# Patient Record
Sex: Female | Born: 1965 | Race: White | Hispanic: No | Marital: Single | Smoking: Never smoker
Health system: Southern US, Community
[De-identification: ages and names within clinical notes are randomized; demographics above are authoritative.]

## PROBLEM LIST (undated history)

## (undated) HISTORY — PX: PARTIAL HYSTERECTOMY: SHX80

## (undated) HISTORY — PX: BREAST ENHANCEMENT SURGERY: SHX7

---

## 2020-04-04 ENCOUNTER — Ambulatory Visit (INDEPENDENT_AMBULATORY_CARE_PROVIDER_SITE_OTHER): Payer: Commercial Managed Care - PPO

## 2020-04-04 ENCOUNTER — Encounter: Payer: Self-pay | Admitting: Podiatry

## 2020-04-04 ENCOUNTER — Ambulatory Visit (INDEPENDENT_AMBULATORY_CARE_PROVIDER_SITE_OTHER): Payer: Commercial Managed Care - PPO | Admitting: Podiatry

## 2020-04-04 ENCOUNTER — Other Ambulatory Visit: Payer: Self-pay

## 2020-04-04 DIAGNOSIS — Q66229 Congenital metatarsus adductus, unspecified foot: Secondary | ICD-10-CM | POA: Diagnosis not present

## 2020-04-04 DIAGNOSIS — M778 Other enthesopathies, not elsewhere classified: Secondary | ICD-10-CM

## 2020-04-04 DIAGNOSIS — M79672 Pain in left foot: Secondary | ICD-10-CM

## 2020-04-04 DIAGNOSIS — M21619 Bunion of unspecified foot: Secondary | ICD-10-CM | POA: Diagnosis not present

## 2020-04-04 DIAGNOSIS — M79671 Pain in right foot: Secondary | ICD-10-CM

## 2020-04-04 MED ORDER — TRIAMCINOLONE ACETONIDE 10 MG/ML IJ SUSP
10.0000 mg | Freq: Once | INTRAMUSCULAR | Status: AC
  Administered 2020-04-04: 10 mg

## 2020-04-04 NOTE — Progress Notes (Signed)
Subjective:   Patient ID: Breanna Herrera, female   DOB: 55 y.o.   MRN: [id number]   HPI Patient presents that she has had a severe bunion deformity right for a while and recently it is becoming more painful for her and more difficult to wear shoe gear with comfortably.  Also points to the top of the foot and states that it is become inflamed and become more aggravating over the last couple years and at times it seems more swollen than other times.  Patient does not smoke likes to be active   Review of Systems  All other systems reviewed and are negative.       Objective:  Physical Exam Vitals and nursing note reviewed.  Constitutional:      Appearance: She is well-developed and well-nourished.  Cardiovascular:     Pulses: Intact distal pulses.  Pulmonary:     Effort: Pulmonary effort is normal.  Musculoskeletal:        General: Normal range of motion.  Skin:    General: Skin is warm.  Neurological:     Mental Status: She is alert.     Neurovascular status intact muscle strength found to be adequate range of motion within normal limits.  Patient has severe bunion deformity right over left with redness around the first metatarsal head deviation of the hallux against the second toe with keratotic tissue around the medial side of the hallux and metatarsal.  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  I do think it will require a Lapidus type fusion for the right first metatarsal and I did discuss possible work on the midfoot with spur removal or other procedures with possible MRI if it does not  respond to conservative care with today sterile prep done and injected the tendon complex 3 mg Dexasone Kenalog. Revaluate one month  xrays indicate severe elevation im angle right over left with met adductus foot structure and spur dorsal right midfoot

## 2020-05-03 ENCOUNTER — Encounter: Payer: Self-pay | Admitting: Podiatry

## 2020-05-03 ENCOUNTER — Ambulatory Visit (INDEPENDENT_AMBULATORY_CARE_PROVIDER_SITE_OTHER): Payer: Commercial Managed Care - PPO | Admitting: Podiatry

## 2020-05-03 ENCOUNTER — Other Ambulatory Visit: Payer: Self-pay

## 2020-05-03 DIAGNOSIS — M778 Other enthesopathies, not elsewhere classified: Secondary | ICD-10-CM | POA: Diagnosis not present

## 2020-05-03 DIAGNOSIS — Q66229 Congenital metatarsus adductus, unspecified foot: Secondary | ICD-10-CM | POA: Diagnosis not present

## 2020-05-03 DIAGNOSIS — M21619 Bunion of unspecified foot: Secondary | ICD-10-CM | POA: Diagnosis not present

## 2020-05-06 NOTE — Progress Notes (Signed)
Subjective:   Patient ID: Breanna Herrera, female   DOB: 55 y.o.   MRN: [id number]   HPI Patient states that the injection we gave at last visit did not help her midfoot and it is actually becoming more painful and swollen and of course she has the severe bunion deformity   ROS      Objective:  Physical Exam  Neurovascular status intact unchanged with patient found to have significant midfoot arthritis with inflammation fluid secondary to severe structural adductus deformity and structural bunion deformity     Assessment:  Chronic midfoot arthritis difficult to identify which joints are involved along with inflammatory changes and structural bunion     Plan:  H&P reviewed condition and reviewed that most likely she is getting the midfoot work done along with the bunion correction. I am sending her for an MRI to better understand the pathology of her midfoot and she will be seen back when we get results and we will start surgical planning for this patient with plans to be able to do this at the beginning of June. Spent a great deal of time going over with her the complexity of her case and we need results before we could make accurate decisions

## 2020-05-29 ENCOUNTER — Ambulatory Visit
Admission: RE | Admit: 2020-05-29 | Discharge: 2020-05-29 | Disposition: A | Discharge status: Home / Self Care | Payer: Commercial Managed Care - PPO | Source: Ambulatory Visit | Attending: Podiatry | Admitting: Podiatry

## 2020-05-29 ENCOUNTER — Other Ambulatory Visit: Payer: Self-pay

## 2020-05-29 DIAGNOSIS — Q66229 Congenital metatarsus adductus, unspecified foot: Secondary | ICD-10-CM

## 2020-06-01 NOTE — Progress Notes (Signed)
mri

## 2020-06-04 ENCOUNTER — Other Ambulatory Visit: Payer: Self-pay | Admitting: *Deleted

## 2020-06-04 DIAGNOSIS — Z1231 Encounter for screening mammogram for malignant neoplasm of breast: Secondary | ICD-10-CM

## 2020-06-13 ENCOUNTER — Encounter: Payer: Self-pay | Admitting: Gastroenterology

## 2020-06-27 ENCOUNTER — Other Ambulatory Visit: Payer: Self-pay

## 2020-06-27 ENCOUNTER — Ambulatory Visit (AMBULATORY_SURGERY_CENTER): Payer: Self-pay | Admitting: *Deleted

## 2020-06-27 VITALS — Ht 65.0 in | Wt 186.0 lb

## 2020-06-27 DIAGNOSIS — Z1211 Encounter for screening for malignant neoplasm of colon: Secondary | ICD-10-CM

## 2020-06-27 MED ORDER — NA SULFATE-K SULFATE-MG SULF 17.5-3.13-1.6 GM/177ML PO SOLN: ORAL | 0 refills | Status: DC

## 2020-06-27 NOTE — Progress Notes (Signed)
Patient is here in-person for PV. Patient denies any allergies to eggs or soy. Patient denies any problems with anesthesia/sedation. Patient denies any oxygen use at home. Patient denies taking any diet/weight loss medications or blood thinners. Patient is not being treated for MRSA or C-diff. Patient is aware of our care-partner policy and [id number] safety protocol. EMMI education assigned to the patient for the procedure, sent to [facility].   Patient is fully COVID-19 vaccinated, per patient.  Patient will continue to take Miralax daily.

## 2020-07-07 DIAGNOSIS — L03116 Cellulitis of left lower limb: Secondary | ICD-10-CM | POA: Diagnosis not present

## 2020-07-10 ENCOUNTER — Encounter: Payer: Self-pay | Admitting: Gastroenterology

## 2020-07-11 ENCOUNTER — Ambulatory Visit (AMBULATORY_SURGERY_CENTER): Payer: Commercial Managed Care - PPO | Admitting: Gastroenterology

## 2020-07-11 ENCOUNTER — Encounter: Payer: Self-pay | Admitting: Gastroenterology

## 2020-07-11 ENCOUNTER — Other Ambulatory Visit: Payer: Self-pay

## 2020-07-11 VITALS — BP 112/68 | HR 66 | Temp 97.3°F | Resp 15 | Ht 65.0 in | Wt 186.0 lb

## 2020-07-11 DIAGNOSIS — D128 Benign neoplasm of rectum: Secondary | ICD-10-CM

## 2020-07-11 DIAGNOSIS — K621 Rectal polyp: Secondary | ICD-10-CM

## 2020-07-11 DIAGNOSIS — Z1211 Encounter for screening for malignant neoplasm of colon: Secondary | ICD-10-CM

## 2020-07-11 DIAGNOSIS — D120 Benign neoplasm of cecum: Secondary | ICD-10-CM

## 2020-07-11 MED ORDER — SODIUM CHLORIDE 0.9 % IV SOLN: 500.0000 mL | Freq: Once | INTRAVENOUS | Status: DC

## 2020-07-11 NOTE — Op Note (Signed)
[facility] Patient Name: Breanna Herrera Procedure Date: 07/11/2020 9:06 AM MRN: [id number] Endoscopist: Remo Lipps P. Havery Moros , MD Age: 55 Referring MD:  Date of Birth: 06-15-1965 Gender: Female Account #: [id number] Procedure:                Colonoscopy Indications:              Screening for colorectal malignant neoplasm, This                            is the patient's first colonoscopy Medicines:                Monitored Anesthesia Care Procedure:                Pre-Anesthesia Assessment:                           - Prior to the procedure, a History and Physical                            was performed, and patient medications and                            allergies were reviewed. The patient's tolerance of                            previous anesthesia was also reviewed. The risks                            and benefits of the procedure and the sedation                            options and risks were discussed with the patient.                            All questions were answered, and informed consent                            was obtained. Prior Anticoagulants: The patient has                            taken no previous anticoagulant or antiplatelet                            agents. ASA Grade Assessment: II - A patient with                            mild systemic disease. After reviewing the risks                            and benefits, the patient was deemed in                            satisfactory condition to undergo the procedure.  After obtaining informed consent, the colonoscope                            was passed under direct vision. Throughout the                            procedure, the patient's blood pressure, pulse, and                            oxygen saturations were monitored continuously. The                            Olympus PCF-H190DL ([id number]) Colonoscope was                            introduced through the  anus and advanced to the the                            cecum, identified by appendiceal orifice and                            ileocecal valve. The colonoscopy was performed                            without difficulty. The patient tolerated the                            procedure well. The quality of the bowel                            preparation was good. The ileocecal valve,                            appendiceal orifice, and rectum were photographed. Scope In: 9:17:15 AM Scope Out: 9:35:55 AM Scope Withdrawal Time: 0 hours 16 minutes 15 seconds  Total Procedure Duration: 0 hours 18 minutes 40 seconds  Findings:                 The perianal and digital rectal examinations were                            normal.                           A diminutive polyp was found in the cecum. The                            polyp was sessile. The polyp was removed with a                            cold snare. Resection and retrieval were complete.                           A 3 mm polyp was found in the rectum. The polyp was  sessile. The polyp was removed with a cold snare.                            Resection and retrieval were complete.                           Internal hemorrhoids were found during                            retroflexion. The hemorrhoids were small.                           The exam was otherwise without abnormality. Complications:            No immediate complications. Estimated blood loss:                            Minimal. Estimated Blood Loss:     Estimated blood loss was minimal. Impression:               - One diminutive polyp in the cecum, removed with a                            cold snare. Resected and retrieved.                           - One 3 mm polyp in the rectum, removed with a cold                            snare. Resected and retrieved.                           - Internal hemorrhoids.                           - The  examination was otherwise normal. Recommendation:           - Patient has a contact number available for                            emergencies. The signs and symptoms of potential                            delayed complications were discussed with the                            patient. Return to normal activities tomorrow.                            Written discharge instructions were provided to the                            patient.                           - Resume previous diet.                           -  Continue present medications.                           - Await pathology results. Viviann Spare P. Akera Snowberger, MD 07/11/2020 9:38:59 AM This report has been signed electronically.

## 2020-07-11 NOTE — Progress Notes (Signed)
To pacu, VSS. Report to [email or web address].tb °

## 2020-07-11 NOTE — Patient Instructions (Signed)
YOU HAD AN ENDOSCOPIC PROCEDURE TODAY AT THE [facility]:   Refer to the procedure report that was given to you for any specific questions about what was found during the examination.  If the procedure report does not answer your questions, please call your gastroenterologist to clarify.  If you requested that your care partner not be given the details of your procedure findings, then the procedure report has been included in a sealed envelope for you to review at your convenience later.  YOU SHOULD EXPECT: Some feelings of bloating in the abdomen. Passage of more gas than usual.  Walking can help get rid of the air that was put into your GI tract during the procedure and reduce the bloating. If you had a lower endoscopy (such as a colonoscopy or flexible sigmoidoscopy) you may notice spotting of blood in your stool or on the toilet paper. If you underwent a bowel prep for your procedure, you may not have a normal bowel movement for a few days.  Please Note:  You might notice some irritation and congestion in your nose or some drainage.  This is from the oxygen used during your procedure.  There is no need for concern and it should clear up in a day or so.  SYMPTOMS TO REPORT IMMEDIATELY:   Following lower endoscopy (colonoscopy or flexible sigmoidoscopy):  Excessive amounts of blood in the stool  Significant tenderness or worsening of abdominal pains  Swelling of the abdomen that is new, acute  Fever of 100F or higher   Following upper endoscopy (EGD)  Vomiting of blood or coffee ground material  New chest pain or pain under the shoulder blades  Painful or persistently difficult swallowing  New shortness of breath  Fever of 100F or higher  Black, tarry-looking stools  For urgent or emergent issues, a gastroenterologist can be reached at any hour by calling [phone]. Do not use MyChart messaging for urgent concerns.    DIET:  We do recommend a small meal at first, but  then you may proceed to your regular diet.  Drink plenty of fluids but you should avoid alcoholic beverages for 24 hours.  ACTIVITY:  You should plan to take it easy for the rest of today and you should NOT DRIVE or use heavy machinery until tomorrow (because of the sedation medicines used during the test).    FOLLOW UP: Our staff will call the number listed on your records 48-72 hours following your procedure to check on you and address any questions or concerns that you may have regarding the information given to you following your procedure. If we do not reach you, we will leave a message.  We will attempt to reach you two times.  During this call, we will ask if you have developed any symptoms of COVID 19. If you develop any symptoms (ie: fever, flu-like symptoms, shortness of breath, cough etc.) before then, please call [phone].  If you test positive for Covid 19 in the 2 weeks post procedure, please call and report this information to us.    If any biopsies were taken you will be contacted by phone or by letter within the next 1-3 weeks.  Please call us at [phone] if you have not heard about the biopsies in 3 weeks.    SIGNATURES/CONFIDENTIALITY: You and/or your care partner have signed paperwork which will be entered into your electronic medical record.  These signatures attest to the fact that that the information above on   your After Visit Summary has been reviewed and is understood.  Full responsibility of the confidentiality of this discharge information lies with you and/or your care-partner. 

## 2020-07-11 NOTE — Progress Notes (Signed)
VS- Susie Farver RN  Pt's states no medical or surgical changes since previsit or office visit.  

## 2020-07-13 ENCOUNTER — Telehealth: Payer: Self-pay

## 2020-07-13 ENCOUNTER — Other Ambulatory Visit: Payer: Self-pay

## 2020-07-13 ENCOUNTER — Ambulatory Visit (INDEPENDENT_AMBULATORY_CARE_PROVIDER_SITE_OTHER): Payer: Commercial Managed Care - PPO | Admitting: Podiatry

## 2020-07-13 DIAGNOSIS — M19079 Primary osteoarthritis, unspecified ankle and foot: Secondary | ICD-10-CM

## 2020-07-13 DIAGNOSIS — M19071 Primary osteoarthritis, right ankle and foot: Secondary | ICD-10-CM

## 2020-07-13 DIAGNOSIS — Z01818 Encounter for other preprocedural examination: Secondary | ICD-10-CM

## 2020-07-13 NOTE — Telephone Encounter (Signed)
Left message on follow up call. 

## 2020-07-13 NOTE — Telephone Encounter (Signed)
No answer, left message to call if having any issues or concerns, B.Armando Lauman RN 

## 2020-07-17 ENCOUNTER — Telehealth: Payer: Self-pay | Admitting: Podiatry

## 2020-07-17 NOTE — Telephone Encounter (Signed)
That is fine. I have not charged her for it yet.

## 2020-07-17 NOTE — Telephone Encounter (Signed)
Patient would like to return the boot she got from our office, and not be charged. Will provider her own.

## 2020-07-18 ENCOUNTER — Encounter: Payer: Self-pay | Admitting: Podiatry

## 2020-07-18 NOTE — Progress Notes (Signed)
Subjective:  Patient ID: Breanna Herrera, female    DOB: [birth year],  MRN: [id number]  Chief Complaint  Patient presents with  . Foot Pain    Surgery consult for right foot     55 y.o. female presents with the above complaint.  Patient presents for surgical consult of the right foot.  Patient has failed all conservative treatment options by Dr. Paulla Dolly for pain at the dorsal midfoot as well as the first metatarsophalangeal joint bone pain.  She she states that it is very painful to touch she would like to discuss treatment options for this.  She would like to discuss surgical options she has failed all conservative treatment options by Dr. Paulla Dolly.  Dr. Paulla Dolly referred me the patient for surgical intervention.  She denies any other acute complaint she still has continuous pain has not resolved with any conservative treatment options   Review of Systems: Negative except as noted in the HPI. Denies N/V/F/Ch.  No past medical history on file.  Current Outpatient Medications:  .  Docusate Calcium (STOOL SOFTENER PO), Take by mouth., Disp: , Rfl:  .  Estradiol-Norethindrone Acet 0.5-0.1 MG tablet, Take 1 tablet by mouth daily., Disp: , Rfl:  .  GLUCOSAMINE-CHONDROITIN PO, Take by mouth., Disp: , Rfl:  .  MAGNESIUM PO, Take by mouth., Disp: , Rfl:  .  Multiple Vitamins-Minerals (CENTRUM SILVER PO), Take by mouth., Disp: , Rfl:  .  multivitamin-lutein (OCUVITE-LUTEIN) CAPS capsule, Take 1 capsule by mouth daily., Disp: , Rfl:  .  Polyethylene Glycol 3350 (MIRALAX PO), Take by mouth., Disp: , Rfl:  .  Turmeric (QC TUMERIC COMPLEX PO), Take by mouth., Disp: , Rfl:  .  VITAMIN E PO, Take by mouth., Disp: , Rfl:   Social History   Tobacco Use  Smoking Status Never Smoker  Smokeless Tobacco Never Used    No Known Allergies Objective:  There were no vitals filed for this visit. There is no height or weight on file to calculate BMI. Constitutional Well developed. Well nourished.  Vascular Dorsalis  pedis pulses palpable bilaterally. Posterior tibial pulses palpable bilaterally. Capillary refill normal to all digits.  No cyanosis or clubbing noted. Pedal hair growth normal.  Neurologic Normal speech. Oriented to person, place, and time. Epicritic sensation to light touch grossly present bilaterally.  Dermatologic Nails well groomed and normal in appearance. No open wounds. No skin lesions.  Orthopedic:  Pain on palpation to the right first metatarsophalangeal joint.  Pain with crepitus to the first MPJ.  Bunion deformity noted as well.  Pain with range of motion of the first MPJ joint.  Pain with range of motion of second and third tarsometatarsal joint.  Crepitus noted to the tarsometatarsal joint.  Pain on palpation to those joints as well.   Radiographs: 3 views of skeletally mature adult right foot reviewed: Cystic changes noted at the first metatarsophalangeal joint with signs of osteoarthritic changes.  Severe osteoarthritic changes noted at the third and second tarsometatarsal joints.  Marrow edema in the diaphyses of the third, fourth and fifth metatarsals consistent with stress change. No fracture.  Midfoot osteoarthritis appears most severe at the third TMT joint.  Marked hallux valgus and mild to moderate first MTP osteoarthritis. Assessment:   1. Arthritis of midfoot   2. Arthritis of first metatarsophalangeal (MTP) joint of right foot   3. Preoperative examination    Plan:  Patient was evaluated and treated and all questions answered.  Right second and third tarsometatarsal joint arthritis and  first metatarsophalangeal joint arthritis -I explained to the patient the etiology of arthritis and various treatment options were extensively discussed with the patient.  Given the amount of pain that she is clinically having without resolve meant with steroid injection and immobilization patient will benefit from surgical fusion of these joints.  I discussed with her that  given that there is arthritis present at the first metatarsophalangeal joint she is not a ideal candidate for first tarsometatarsal joint fusion to reduce the bunion deformity.  At this time the best reduction she will get is from the first metatarsophalangeal joint fusion.  She states understanding and would like to proceed with first MPJ fusion on the right foot.  I also discussed with her given the amount of pain she is having a second and third tarsometatarsal joint she will benefit from the fusion of that joint as well.  MRI and radiographs clinically confirm the arthritis at the midfoot joints with clinically most pain being at the second and third tarsometatarsal joint.  I discussed my preop as well as my postoperative plan for this patient.  She states that she understands all the risk and would like to proceed with that.  She will be nonweightbearing for 4 to 6 weeks followed by weightbearing as tolerated with a cam boot.  She -Informed surgical risk consent was reviewed and read aloud to the patient.  I reviewed the films.  I have discussed my findings with the patient in great detail.  I have discussed all risks including but not limited to infection, stiffness, scarring, limp, disability, deformity, damage to blood vessels and nerves, numbness, poor healing, need for braces, arthritis, chronic pain, amputation, death.  All benefits and realistic expectations discussed in great detail.  I have made no promises as to the outcome.  I have provided realistic expectations.  I have offered the patient a 2nd opinion, which they have declined and assured me they preferred to proceed despite the risks   No follow-ups on file.

## 2020-07-23 ENCOUNTER — Telehealth: Payer: Self-pay | Admitting: Urology

## 2020-07-23 NOTE — Telephone Encounter (Signed)
DOS - 08/13/20  ARTHRODESIS 2ND AND 3RD RIGHT --- 28730 HALLUS IPJ FUSION 1ST RIGHT --- 63893   [facility] EFFECTIVE DATE - 03/10/20   PLAN DEDUCTIBLE - $3,000.00 W/ $0.00 REMAINING OUT OF POCKET - $3,000.00 W/ $0.00 REMAINING COINSURANCE - 0% COPAY -  $0.00   SPOKE WITH ANDY WITH [facility] AND HE STATED THAT FOR CPT CODES 73428 AND 76811 NO PRIOR AUTH IS REQUIRED.  REF # [id number]

## 2020-07-27 ENCOUNTER — Ambulatory Visit: Payer: Commercial Managed Care - PPO

## 2020-08-10 ENCOUNTER — Ambulatory Visit (INDEPENDENT_AMBULATORY_CARE_PROVIDER_SITE_OTHER): Payer: Commercial Managed Care - PPO

## 2020-08-10 ENCOUNTER — Ambulatory Visit (INDEPENDENT_AMBULATORY_CARE_PROVIDER_SITE_OTHER): Payer: Commercial Managed Care - PPO | Admitting: Podiatry

## 2020-08-10 ENCOUNTER — Other Ambulatory Visit: Payer: Self-pay

## 2020-08-10 DIAGNOSIS — M7732 Calcaneal spur, left foot: Secondary | ICD-10-CM | POA: Diagnosis not present

## 2020-08-10 DIAGNOSIS — M7662 Achilles tendinitis, left leg: Secondary | ICD-10-CM

## 2020-08-10 MED ORDER — METHYLPREDNISOLONE 4 MG PO TBPK: ORAL_TABLET | ORAL | 0 refills | Status: AC

## 2020-08-13 ENCOUNTER — Other Ambulatory Visit: Payer: Self-pay | Admitting: Podiatry

## 2020-08-13 ENCOUNTER — Encounter: Payer: Self-pay | Admitting: Podiatry

## 2020-08-13 DIAGNOSIS — M19071 Primary osteoarthritis, right ankle and foot: Secondary | ICD-10-CM | POA: Diagnosis not present

## 2020-08-13 MED ORDER — OXYCODONE-ACETAMINOPHEN 5-325 MG PO TABS: 1.0000 | ORAL_TABLET | ORAL | 0 refills | Status: DC | PRN

## 2020-08-13 MED ORDER — IBUPROFEN 800 MG PO TABS: 800.0000 mg | ORAL_TABLET | Freq: Four times a day (QID) | ORAL | 1 refills | Status: AC | PRN

## 2020-08-14 ENCOUNTER — Encounter: Payer: Self-pay | Admitting: Podiatry

## 2020-08-14 NOTE — Progress Notes (Signed)
Subjective:  Patient ID: Breanna Herrera, female    DOB: 09/10/1965,  MRN: [id number]  No chief complaint on file.   55 y.o. female presents with the above complaint.  Patient presents with a follow-up of a new complaint to the left Achilles tendon.  She states that she was on her foot and it started hurting and then left posterior part of the heel.  She has surgery scheduled with me for the contralateral foot to the other side.  She wanted to get this evaluated and treated before the surgery.  She would like to know if she should cancel the surgery as well.  She denies any other issues.   Review of Systems: Negative except as noted in the HPI. Denies N/V/F/Ch.  No past medical history on file.  Current Outpatient Medications:  .  methylPREDNISolone (MEDROL DOSEPAK) 4 MG TBPK tablet, Take as directed, Disp: 21 each, Rfl: 0 .  Docusate Calcium (STOOL SOFTENER PO), Take by mouth., Disp: , Rfl:  .  Estradiol-Norethindrone Acet 0.5-0.1 MG tablet, Take 1 tablet by mouth daily., Disp: , Rfl:  .  GLUCOSAMINE-CHONDROITIN PO, Take by mouth., Disp: , Rfl:  .  ibuprofen (ADVIL) 800 MG tablet, Take 1 tablet (800 mg total) by mouth every 6 (six) hours as needed., Disp: 60 tablet, Rfl: 1 .  MAGNESIUM PO, Take by mouth., Disp: , Rfl:  .  Multiple Vitamins-Minerals (CENTRUM SILVER PO), Take by mouth., Disp: , Rfl:  .  multivitamin-lutein (OCUVITE-LUTEIN) CAPS capsule, Take 1 capsule by mouth daily., Disp: , Rfl:  .  oxyCODONE-acetaminophen (PERCOCET) 5-325 MG tablet, Take 1-2 tablets by mouth every 4 (four) hours as needed for severe pain., Disp: 30 tablet, Rfl: 0 .  Polyethylene Glycol 3350 (MIRALAX PO), Take by mouth., Disp: , Rfl:  .  Turmeric (QC TUMERIC COMPLEX PO), Take by mouth., Disp: , Rfl:  .  VITAMIN E PO, Take by mouth., Disp: , Rfl:   Social History   Tobacco Use  Smoking Status Never Smoker  Smokeless Tobacco Never Used    No Known Allergies Objective:  There were no vitals filed for  this visit. There is no height or weight on file to calculate BMI. Constitutional Well developed. Well nourished.  Vascular Dorsalis pedis pulses palpable bilaterally. Posterior tibial pulses palpable bilaterally. Capillary refill normal to all digits.  No cyanosis or clubbing noted. Pedal hair growth normal.  Neurologic Normal speech. Oriented to person, place, and time. Epicritic sensation to light touch grossly present bilaterally.  Dermatologic Nails well groomed and normal in appearance. No open wounds. No skin lesions.  Orthopedic:  Pain on palpation to the right first metatarsophalangeal joint.  Pain with crepitus to the first MPJ.  Bunion deformity noted as well.  Pain with range of motion of the first MPJ joint.  Pain with range of motion of second and third tarsometatarsal joint.  Crepitus noted to the tarsometatarsal joint.  Pain on palpation to those joints as well.  Pain on palpation to the left Achilles tendon insertion as well as along the course of the tendon.  The Achilles tendon is intact.  Good strength noted for dorsiflexion plantarflexion of the ankle joint.  More pain is elicited with dorsiflexion of the ankle.  No deep intra-articular pain noted.   Radiographs: 3 views of skeletally mature adult right foot reviewed: Cystic changes noted at the first metatarsophalangeal joint with signs of osteoarthritic changes.  Severe osteoarthritic changes noted at the third and second tarsometatarsal joints.  Marrow edema  in the diaphyses of the third, fourth and fifth metatarsals consistent with stress change. No fracture.  Midfoot osteoarthritis appears most severe at the third TMT joint.  Marked hallux valgus and mild to moderate first MTP osteoarthritis.  3 views of skeletally mature the left foot: Posterior heel spurring noted Small in nature.  No other bony abnormalities identified.  Small plantar spurring noted as well. Assessment:   1. Achilles tendinitis of left  lower extremity   2. Heel spur, left    Plan:  Patient was evaluated and treated and all questions answered.  Left Achilles tendinitis with underlying posterior heel spur -I explained the patient the etiology of Achilles tendinitis and various treatment options were discussed.  Given that this may have flared up recently when she has been doing a lot on her foot I believe patient will benefit from steroid injection help decrease acute inflammatory component associated pain.  Patient I discussed with the patient that there is a risk of tendon rupture associated with it.  She states understanding -A steroid injection was performed at left Kager's fat pad using 1% plain Lidocaine and 10 mg of Kenalog. This was well tolerated. -Also discussed with the patient that we will have to cancel the surgery if her left Achilles not healed during before surgery.  She states understanding and will follow decide in the preoperative area on Monday. 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  Given the amount of pain that she is clinically having without resolve meant with steroid injection and immobilization patient will benefit from surgical fusion of these joints.  I discussed with her that given that there is arthritis present at the first metatarsophalangeal joint she is not a ideal candidate for first tarsometatarsal joint fusion to reduce the bunion deformity.  At this time the best reduction she will get is from the first metatarsophalangeal joint fusion.  She states understanding and would like to proceed with first MPJ fusion on the right foot.  I also discussed with her given the amount of pain she is having a second and third tarsometatarsal joint she will benefit from the fusion of  that joint as well.  MRI and radiographs clinically confirm the arthritis at the midfoot joints with clinically most pain being at the second and third tarsometatarsal joint.  I discussed my preop as well as my postoperative plan for this patient.  She states that she understands all the risk and would like to proceed with that.  She will be nonweightbearing for 4 to 6 weeks followed by weightbearing as tolerated with a cam boot.  She -Informed surgical risk consent was reviewed and read aloud to the patient.  I reviewed the films.  I have discussed my findings with the patient in great detail.  I have discussed all risks including but not limited to infection, stiffness, scarring, limp, disability, deformity, damage to blood vessels and nerves, numbness, poor healing, need for braces, arthritis, chronic pain, amputation, death.  All benefits and realistic expectations discussed in great detail.  I have made no promises as to the outcome.  I have provided realistic expectations.  I have offered the patient a 2nd opinion, which they have declined and assured me they preferred to proceed despite the risks   No follow-ups on file.

## 2020-08-22 ENCOUNTER — Ambulatory Visit (INDEPENDENT_AMBULATORY_CARE_PROVIDER_SITE_OTHER): Payer: Commercial Managed Care - PPO

## 2020-08-22 ENCOUNTER — Ambulatory Visit (INDEPENDENT_AMBULATORY_CARE_PROVIDER_SITE_OTHER): Payer: Commercial Managed Care - PPO | Admitting: Podiatry

## 2020-08-22 ENCOUNTER — Other Ambulatory Visit: Payer: Self-pay

## 2020-08-22 DIAGNOSIS — M19079 Primary osteoarthritis, unspecified ankle and foot: Secondary | ICD-10-CM

## 2020-08-22 DIAGNOSIS — Z9889 Other specified postprocedural states: Secondary | ICD-10-CM

## 2020-08-22 DIAGNOSIS — M19071 Primary osteoarthritis, right ankle and foot: Secondary | ICD-10-CM

## 2020-08-22 MED ORDER — OXYCODONE-ACETAMINOPHEN 5-325 MG PO TABS: 1.0000 | ORAL_TABLET | ORAL | 0 refills | Status: DC | PRN

## 2020-08-22 NOTE — Progress Notes (Signed)
  Subjective:  Patient ID: Breanna Herrera, female    DOB: 23-Aug-1965,  MRN: [id number]  Chief Complaint  Patient presents with   Routine Post Op    DOS: 08/13/2020 Procedure: Right first MPJ fusion and second and third tarsometatarsal joint fusion  55 y.o. female returns for post-op check.  Patient states she is doing well.  She has been doing well on pain medication.  She has not been taking much Percocet.  Her pain has been controlled primarily ibuprofen.  She has been nonweightbearing to the right lower extremity.  She denies any other acute complaints.  Review of Systems: Negative except as noted in the HPI. Denies N/V/F/Ch.  No past medical history on file.  Current Outpatient Medications:    oxyCODONE-acetaminophen (PERCOCET) 5-325 MG tablet, Take 1-2 tablets by mouth every 4 (four) hours as needed for severe pain., Disp: 30 tablet, Rfl: 0   Docusate Calcium (STOOL SOFTENER PO), Take by mouth., Disp: , Rfl:    Estradiol-Norethindrone Acet 0.5-0.1 MG tablet, Take 1 tablet by mouth daily., Disp: , Rfl:    GLUCOSAMINE-CHONDROITIN PO, Take by mouth., Disp: , Rfl:    ibuprofen (ADVIL) 800 MG tablet, Take 1 tablet (800 mg total) by mouth every 6 (six) hours as needed., Disp: 60 tablet, Rfl: 1   MAGNESIUM PO, Take by mouth., Disp: , Rfl:    methylPREDNISolone (MEDROL DOSEPAK) 4 MG TBPK tablet, Take as directed, Disp: 21 each, Rfl: 0   Multiple Vitamins-Minerals (CENTRUM SILVER PO), Take by mouth., Disp: , Rfl:    multivitamin-lutein (OCUVITE-LUTEIN) CAPS capsule, Take 1 capsule by mouth daily., Disp: , Rfl:    oxyCODONE-acetaminophen (PERCOCET) 5-325 MG tablet, Take 1-2 tablets by mouth every 4 (four) hours as needed for severe pain., Disp: 30 tablet, Rfl: 0   Polyethylene Glycol 3350 (MIRALAX PO), Take by mouth., Disp: , Rfl:    Turmeric (QC TUMERIC COMPLEX PO), Take by mouth., Disp: , Rfl:    VITAMIN E PO, Take by mouth., Disp: , Rfl:   Social History   Tobacco Use  Smoking Status Never   Smokeless Tobacco Never    No Known Allergies Objective:  There were no vitals filed for this visit. There is no height or weight on file to calculate BMI. Constitutional Well developed. Well nourished.  Vascular Foot warm and well perfused. Capillary refill normal to all digits.   Neurologic Normal speech. Oriented to person, place, and time. Epicritic sensation to light touch grossly present bilaterally.  Dermatologic Skin healing well without signs of infection. Skin edges well coapted without signs of infection.  Orthopedic: Tenderness to palpation noted about the surgical site.   Radiographs: 3 views of skeletally mature the right foot: Good correction alignment noted.  Hardware is intact.  No signs of loosening or backing out noted.  Good consolidation noted Assessment:   1. Post-operative state   2. Arthritis of midfoot   3. Arthritis of first metatarsophalangeal (MTP) joint of right foot    Plan:  Patient was evaluated and treated and all questions answered.  S/p foot surgery right -Progressing as expected post-operatively. -XR: See above -WB Status: Nonweightbearing in right lower extremity knee scooter -Sutures: Intact.  No clinical signs of dehiscence noted.  No complication noted. -Medications: None -Foot redressed.  No follow-ups on file.

## 2020-08-24 ENCOUNTER — Encounter: Payer: Self-pay | Admitting: Podiatry

## 2020-09-05 ENCOUNTER — Other Ambulatory Visit: Payer: Self-pay

## 2020-09-05 ENCOUNTER — Ambulatory Visit (INDEPENDENT_AMBULATORY_CARE_PROVIDER_SITE_OTHER): Payer: Commercial Managed Care - PPO | Admitting: Podiatry

## 2020-09-05 DIAGNOSIS — M19079 Primary osteoarthritis, unspecified ankle and foot: Secondary | ICD-10-CM

## 2020-09-05 DIAGNOSIS — M19071 Primary osteoarthritis, right ankle and foot: Secondary | ICD-10-CM

## 2020-09-05 DIAGNOSIS — Z9889 Other specified postprocedural states: Secondary | ICD-10-CM

## 2020-09-10 ENCOUNTER — Encounter: Payer: Self-pay | Admitting: Podiatry

## 2020-09-10 NOTE — Progress Notes (Signed)
Subjective:  Patient ID: Breanna Herrera, female    DOB: 03-Mar-1966,  MRN: [id number]  Chief Complaint  Patient presents with   Routine Post Op    POST OP DOS 6.20.22    DOS: 08/13/2020 Procedure: Right first MPJ fusion and second and third tarsometatarsal joint fusion  55 y.o. female returns for post-op check.  Patient states she is doing well.  She has been doing well on pain medication.  She has not been taking much Percocet.  Her pain has been controlled primarily ibuprofen.  She has been nonweightbearing to the right lower extremity.  She denies any other acute complaints.  Review of Systems: Negative except as noted in the HPI. Denies N/V/F/Ch.  No past medical history on file.  Current Outpatient Medications:    Docusate Calcium (STOOL SOFTENER PO), Take by mouth., Disp: , Rfl:    Estradiol-Norethindrone Acet 0.5-0.1 MG tablet, Take 1 tablet by mouth daily., Disp: , Rfl:    GLUCOSAMINE-CHONDROITIN PO, Take by mouth., Disp: , Rfl:    ibuprofen (ADVIL) 800 MG tablet, Take 1 tablet (800 mg total) by mouth every 6 (six) hours as needed., Disp: 60 tablet, Rfl: 1   MAGNESIUM PO, Take by mouth., Disp: , Rfl:    methylPREDNISolone (MEDROL DOSEPAK) 4 MG TBPK tablet, Take as directed, Disp: 21 each, Rfl: 0   Multiple Vitamins-Minerals (CENTRUM SILVER PO), Take by mouth., Disp: , Rfl:    multivitamin-lutein (OCUVITE-LUTEIN) CAPS capsule, Take 1 capsule by mouth daily., Disp: , Rfl:    oxyCODONE-acetaminophen (PERCOCET) 5-325 MG tablet, Take 1-2 tablets by mouth every 4 (four) hours as needed for severe pain., Disp: 30 tablet, Rfl: 0   oxyCODONE-acetaminophen (PERCOCET) 5-325 MG tablet, Take 1-2 tablets by mouth every 4 (four) hours as needed for severe pain., Disp: 30 tablet, Rfl: 0   Polyethylene Glycol 3350 (MIRALAX PO), Take by mouth., Disp: , Rfl:    Turmeric (QC TUMERIC COMPLEX PO), Take by mouth., Disp: , Rfl:    VITAMIN E PO, Take by mouth., Disp: , Rfl:   Social History   Tobacco  Use  Smoking Status Never  Smokeless Tobacco Never    No Known Allergies Objective:  There were no vitals filed for this visit. There is no height or weight on file to calculate BMI. Constitutional Well developed. Well nourished.  Vascular Foot warm and well perfused. Capillary refill normal to all digits.   Neurologic Normal speech. Oriented to person, place, and time. Epicritic sensation to light touch grossly present bilaterally.  Dermatologic Skin completely epithelialized.  No complication noted no dehiscence noted.  Stiff right first metatarsophalangeal joint noted.  No movement noted at the first MPJ.  No movement noted at the second and third tarsometatarsal joint.  No painful orthopedic hardware noted at this time  Orthopedic: Mild tenderness to palpation noted about the surgical site.   Radiographs: 3 views of skeletally mature the right foot: Good correction alignment noted.  Hardware is intact.  No signs of loosening or backing out noted.  Good consolidation noted Assessment:   1. Post-operative state   2. Arthritis of midfoot   3. Arthritis of first metatarsophalangeal (MTP) joint of right foot     Plan:  Patient was evaluated and treated and all questions answered.  S/p foot surgery right -Progressing as expected post-operatively. -XR: See above -WB Status: Nonweightbearing in right lower extremity knee scooter -Sutures: Removed.  No clinical signs of dehiscence noted.  No complication noted. -Medications: None -Foot redressed.  No  follow-ups on file.

## 2020-10-05 ENCOUNTER — Encounter: Payer: Self-pay | Admitting: Podiatry

## 2020-10-05 ENCOUNTER — Ambulatory Visit (INDEPENDENT_AMBULATORY_CARE_PROVIDER_SITE_OTHER): Payer: Commercial Managed Care - PPO

## 2020-10-05 ENCOUNTER — Ambulatory Visit (INDEPENDENT_AMBULATORY_CARE_PROVIDER_SITE_OTHER): Payer: Commercial Managed Care - PPO | Admitting: Podiatry

## 2020-10-05 ENCOUNTER — Other Ambulatory Visit: Payer: Self-pay

## 2020-10-05 DIAGNOSIS — M19071 Primary osteoarthritis, right ankle and foot: Secondary | ICD-10-CM | POA: Diagnosis not present

## 2020-10-05 DIAGNOSIS — Z9889 Other specified postprocedural states: Secondary | ICD-10-CM

## 2020-10-05 DIAGNOSIS — M19079 Primary osteoarthritis, unspecified ankle and foot: Secondary | ICD-10-CM

## 2020-10-10 ENCOUNTER — Encounter: Payer: Self-pay | Admitting: Podiatry

## 2020-10-10 NOTE — Progress Notes (Signed)
Subjective:  Patient ID: Breanna Herrera, female    DOB: 09/06/1965,  MRN: [id number]  Chief Complaint  Patient presents with   Routine Post Op    POS DOS 6.6.22    DOS: 08/13/2020 Procedure: Right first MPJ fusion and second and third tarsometatarsal joint fusion  55 y.o. female returns for post-op check.  Patient states she is doing well.  She has been doing well on pain medication.  She has been weightbearing as tolerated in cam boot.  She is doing well.  She would like to know if she can transition to regular shoes.  No acute complaints.  Review of Systems: Negative except as noted in the HPI. Denies N/V/F/Ch.  History reviewed. No pertinent past medical history.  Current Outpatient Medications:    Docusate Calcium (STOOL SOFTENER PO), Take by mouth., Disp: , Rfl:    Estradiol-Norethindrone Acet 0.5-0.1 MG tablet, Take 1 tablet by mouth daily., Disp: , Rfl:    GLUCOSAMINE-CHONDROITIN PO, Take by mouth., Disp: , Rfl:    ibuprofen (ADVIL) 800 MG tablet, Take 1 tablet (800 mg total) by mouth every 6 (six) hours as needed., Disp: 60 tablet, Rfl: 1   MAGNESIUM PO, Take by mouth., Disp: , Rfl:    methylPREDNISolone (MEDROL DOSEPAK) 4 MG TBPK tablet, Take as directed, Disp: 21 each, Rfl: 0   Multiple Vitamins-Minerals (CENTRUM SILVER PO), Take by mouth., Disp: , Rfl:    multivitamin-lutein (OCUVITE-LUTEIN) CAPS capsule, Take 1 capsule by mouth daily., Disp: , Rfl:    oxyCODONE-acetaminophen (PERCOCET) 5-325 MG tablet, Take 1-2 tablets by mouth every 4 (four) hours as needed for severe pain., Disp: 30 tablet, Rfl: 0   oxyCODONE-acetaminophen (PERCOCET) 5-325 MG tablet, Take 1-2 tablets by mouth every 4 (four) hours as needed for severe pain., Disp: 30 tablet, Rfl: 0   Polyethylene Glycol 3350 (MIRALAX PO), Take by mouth., Disp: , Rfl:    Turmeric (QC TUMERIC COMPLEX PO), Take by mouth., Disp: , Rfl:    VITAMIN E PO, Take by mouth., Disp: , Rfl:   Social History   Tobacco Use  Smoking  Status Never  Smokeless Tobacco Never    No Known Allergies Objective:  There were no vitals filed for this visit. There is no height or weight on file to calculate BMI. Constitutional Well developed. Well nourished.  Vascular Foot warm and well perfused. Capillary refill normal to all digits.   Neurologic Normal speech. Oriented to person, place, and time. Epicritic sensation to light touch grossly present bilaterally.  Dermatologic Skin completely epithelialized.  No complication noted no dehiscence noted.  Stiff right first metatarsophalangeal joint noted.  No movement noted at the first MPJ.  No movement noted at the second and third tarsometatarsal joint.  No painful orthopedic hardware noted at this time  Orthopedic: Mild tenderness to palpation noted about the surgical site.   Radiographs: 3 views of skeletally mature the right foot: Good correction alignment noted.  Hardware is intact.  No signs of loosening or backing out noted.  Good consolidation noted Assessment:   1. Post-operative state   2. Arthritis of midfoot   3. Arthritis of first metatarsophalangeal (MTP) joint of right foot     Plan:  Patient was evaluated and treated and all questions answered.  S/p foot surgery right -Progressing as expected post-operatively. -XR: See above -WB Status: Begin weightbearing as tolerated in surgical shoe -Sutures: Removed.  No clinical signs of dehiscence noted.  No complication noted. -Medications: None -Mild edema noted to the  dorsum foot.  Associated with pain.  Overall acutely healing well.  No follow-ups on file.

## 2020-10-22 ENCOUNTER — Other Ambulatory Visit: Payer: Self-pay

## 2020-10-22 ENCOUNTER — Ambulatory Visit
Admission: RE | Admit: 2020-10-22 | Discharge: 2020-10-22 | Disposition: A | Discharge status: Home / Self Care | Payer: Commercial Managed Care - PPO | Source: Ambulatory Visit | Attending: *Deleted | Admitting: *Deleted

## 2020-10-22 DIAGNOSIS — Z1231 Encounter for screening mammogram for malignant neoplasm of breast: Secondary | ICD-10-CM

## 2020-11-28 ENCOUNTER — Ambulatory Visit: Payer: Commercial Managed Care - PPO | Admitting: Podiatry

## 2020-12-10 ENCOUNTER — Ambulatory Visit (INDEPENDENT_AMBULATORY_CARE_PROVIDER_SITE_OTHER): Payer: Commercial Managed Care - PPO | Admitting: Podiatry

## 2020-12-10 ENCOUNTER — Other Ambulatory Visit: Payer: Self-pay

## 2020-12-10 ENCOUNTER — Ambulatory Visit (INDEPENDENT_AMBULATORY_CARE_PROVIDER_SITE_OTHER): Payer: Commercial Managed Care - PPO

## 2020-12-10 DIAGNOSIS — M2011 Hallux valgus (acquired), right foot: Secondary | ICD-10-CM

## 2020-12-10 DIAGNOSIS — M2012 Hallux valgus (acquired), left foot: Secondary | ICD-10-CM

## 2020-12-10 DIAGNOSIS — M21622 Bunionette of left foot: Secondary | ICD-10-CM | POA: Diagnosis not present

## 2020-12-18 NOTE — Progress Notes (Signed)
   Subjective: 55 y.o. female presents today for surgical consultation regarding left foot pain.  Patient recently had right foot surgery with Dr. Posey Pronto 08/13/2020.  Patient has gone through the postoperative healing.  Today she would like to discuss with me addressing her left foot now.  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  They are very symptomatic despite conservative treatment modalities.  She has tried different shoe gear modifications with minimal relief.  She presents for surgical consultation and to proceed with scheduling surgery for her left foot.   No past medical history on file.    Objective: Physical Exam General: The patient is alert and oriented x3 in no acute distress.  Dermatology: Skin is cool, dry and supple bilateral lower extremities. Negative for open lesions or macerations.  Vascular: Palpable pedal pulses bilaterally. No edema or erythema noted. Capillary refill within normal limits.  Neurological: Epicritic and protective threshold grossly intact bilaterally.   Musculoskeletal Exam: Clinical evidence of bunion deformity noted to the respective foot. There is moderate pain on palpation range of motion of the first MPJ. Lateral deviation of the hallux noted consistent with hallux abductovalgus.  Also noted is a tailor's bunionette deformity on clinical exam with lateral deviation of the fifth metatarsal head which is prominent with associated tenderness to palpation  Radiographic Exam: Increased intermetatarsal angle greater than 15 with a hallux abductus angle greater than 30 noted on AP view. Moderate degenerative changes noted within the first MPJ.  Lateral deviation of the fifth metatarsal head noted on AP view with a slight increase in the intermetatarsal space noted.  There is also some mild to moderate metatarsus adductus deformity however I do not recommend surgically correcting for this.  Should be  able to get good correction with the bunion and tailor's bunion deformity and alleviate patient's symptoms  Assessment: 1. HAV w/ bunion deformity left 2.  Tailor's bunionette left 3. H/o right foot surgery.  Dr. Posey Pronto.  DOS: 08/13/2020   Plan of Care:  1. Patient was evaluated. X-Rays reviewed. 2. Today we discussed the conservative versus surgical management of the presenting pathology. The patient opts for surgical management. All possible complications and details of the procedure were explained. All patient questions were answered. No guarantees were expressed or implied. 3. Authorization for surgery was initiated today. Surgery will consist of bunionectomy with first metatarsal osteotomy and tailor's bunionectomy with fifth metatarsal osteotomy 4.  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 Edrick Kins, DPM    [address], [place] [address]                Office [phone]  Fax [phone]

## 2020-12-28 ENCOUNTER — Ambulatory Visit: Payer: Commercial Managed Care - PPO | Admitting: Podiatry

## 2021-02-14 ENCOUNTER — Telehealth: Payer: Self-pay | Admitting: Urology

## 2021-02-14 NOTE — Telephone Encounter (Signed)
DOS - 02/28/21  [place] BUNIONECTOMY LEFT --- 63943 METATARSAL OSTEOTOMY LEFT --- 20037   [facility] EFFECTIVE DATE - 03/10/20     PLAN DEDUCTIBLE - $3,000.00 W/ $0.00 REMAINING OUT OF POCKET - $3,000.00 W/ $0.00 REMAINING COINSURANCE - 0% COPAY -  $0.00   SPOKE WITH AMANDA WITH [facility] AND SHE STATED THAT FOR CPT CODES 94446 AND 19012 NO PRIOR AUTH IS REQUIRED.   REF # [id number]

## 2021-02-28 ENCOUNTER — Encounter: Payer: Self-pay | Admitting: Podiatry

## 2021-02-28 ENCOUNTER — Other Ambulatory Visit: Payer: Self-pay | Admitting: Podiatry

## 2021-02-28 DIAGNOSIS — M2012 Hallux valgus (acquired), left foot: Secondary | ICD-10-CM | POA: Diagnosis not present

## 2021-02-28 MED ORDER — DICLOFENAC SODIUM 75 MG PO TBEC: 75.0000 mg | DELAYED_RELEASE_TABLET | Freq: Two times a day (BID) | ORAL | 1 refills | Status: AC

## 2021-02-28 MED ORDER — OXYCODONE-ACETAMINOPHEN 5-325 MG PO TABS: 1.0000 | ORAL_TABLET | ORAL | 0 refills | Status: AC | PRN

## 2021-02-28 NOTE — Progress Notes (Signed)
PRN postop 

## 2021-03-06 ENCOUNTER — Ambulatory Visit (INDEPENDENT_AMBULATORY_CARE_PROVIDER_SITE_OTHER): Payer: Commercial Managed Care - PPO | Admitting: Podiatry

## 2021-03-06 ENCOUNTER — Other Ambulatory Visit: Payer: Self-pay

## 2021-03-06 ENCOUNTER — Ambulatory Visit (INDEPENDENT_AMBULATORY_CARE_PROVIDER_SITE_OTHER): Payer: Commercial Managed Care - PPO

## 2021-03-06 ENCOUNTER — Encounter: Payer: Commercial Managed Care - PPO | Admitting: Podiatry

## 2021-03-06 DIAGNOSIS — M2012 Hallux valgus (acquired), left foot: Secondary | ICD-10-CM | POA: Diagnosis not present

## 2021-03-06 DIAGNOSIS — M21622 Bunionette of left foot: Secondary | ICD-10-CM

## 2021-03-06 DIAGNOSIS — Z9889 Other specified postprocedural states: Secondary | ICD-10-CM

## 2021-03-06 MED ORDER — DOXYCYCLINE HYCLATE 100 MG PO TABS: 100.0000 mg | ORAL_TABLET | Freq: Two times a day (BID) | ORAL | 0 refills | Status: AC

## 2021-03-07 ENCOUNTER — Telehealth: Payer: Self-pay | Admitting: Podiatry

## 2021-03-08 NOTE — Progress Notes (Signed)
°  Subjective:  Patient ID: Breanna Herrera, female    DOB: May 20, 1965,  MRN: [id number]  Chief Complaint  Patient presents with   Routine Post Op     POV #1 DOS 02/28/2021 BUNIONECTOMY W/OSTEOTOMY LT, TAILORS BUNIONECTOMY W/OSTEOTOMY    DOS: 02/28/2021 Procedure: Bunionectomy with tailor's bunion ectomy with osteotomy  55 y.o. female returns for post-op check.  Patient states that she is doing well.  She is known to Dr. Amalia Hailey.  He did the last surgery.  No nausea fever chills vomiting.  Bandages clean dry and intact.  Review of Systems: Negative except as noted in the HPI. Denies N/V/F/Ch.  No past medical history on file.  Current Outpatient Medications:    diclofenac (VOLTAREN) 75 MG EC tablet, Take 1 tablet (75 mg total) by mouth 2 (two) times daily., Disp: 60 tablet, Rfl: 1   doxycycline (VIBRA-TABS) 100 MG tablet, Take 1 tablet (100 mg total) by mouth 2 (two) times daily., Disp: 20 tablet, Rfl: 0   Docusate Calcium (STOOL SOFTENER PO), Take by mouth., Disp: , Rfl:    Estradiol-Norethindrone Acet 0.5-0.1 MG tablet, Take 1 tablet by mouth daily., Disp: , Rfl:    GLUCOSAMINE-CHONDROITIN PO, Take by mouth., Disp: , Rfl:    ibuprofen (ADVIL) 800 MG tablet, Take 1 tablet (800 mg total) by mouth every 6 (six) hours as needed., Disp: 60 tablet, Rfl: 1   MAGNESIUM PO, Take by mouth., Disp: , Rfl:    methylPREDNISolone (MEDROL DOSEPAK) 4 MG TBPK tablet, Take as directed, Disp: 21 each, Rfl: 0   Multiple Vitamins-Minerals (CENTRUM SILVER PO), Take by mouth., Disp: , Rfl:    multivitamin-lutein (OCUVITE-LUTEIN) CAPS capsule, Take 1 capsule by mouth daily., Disp: , Rfl:    oxyCODONE-acetaminophen (PERCOCET) 5-325 MG tablet, Take 1-2 tablets by mouth every 4 (four) hours as needed for severe pain., Disp: 30 tablet, Rfl: 0   Polyethylene Glycol 3350 (MIRALAX PO), Take by mouth., Disp: , Rfl:    Turmeric (QC TUMERIC COMPLEX PO), Take by mouth., Disp: , Rfl:    VITAMIN E PO, Take by mouth., Disp: ,  Rfl:   Social History   Tobacco Use  Smoking Status Never  Smokeless Tobacco Never    No Known Allergies Objective:  There were no vitals filed for this visit. There is no height or weight on file to calculate BMI. Constitutional Well developed. Well nourished.  Vascular Foot warm and well perfused. Capillary refill normal to all digits.   Neurologic Normal speech. Oriented to person, place, and time. Epicritic sensation to light touch grossly present bilaterally.  Dermatologic Skin healing well without signs of infection. Skin edges well coapted without signs of infection.  Orthopedic: Tenderness to palpation noted about the surgical site.   Radiographs: 3 views of skeletally mature adult Left foot: Good correction alignment noted.  Hardware is intact.  No signs of backing out or loosening noted.  Reduction of sesamoid is noted. Assessment:   1. Hallux valgus, left   2. Tailor's bunionette, left   3. Post-operative state    Plan:  Patient was evaluated and treated and all questions answered.  S/p foot surgery left -Progressing as expected post-operatively. -XR: See above -WB Status: Nonweightbearing to the left lower extremity in a cam boot -Sutures: Intact.  No signs of dehiscence noted.  No complication noted. -Medications: None -Foot redressed.  No follow-ups on file.

## 2021-03-13 ENCOUNTER — Ambulatory Visit (INDEPENDENT_AMBULATORY_CARE_PROVIDER_SITE_OTHER): Payer: Commercial Managed Care - PPO | Admitting: Podiatry

## 2021-03-13 ENCOUNTER — Other Ambulatory Visit: Payer: Self-pay

## 2021-03-13 DIAGNOSIS — Z9889 Other specified postprocedural states: Secondary | ICD-10-CM

## 2021-03-13 NOTE — Progress Notes (Signed)
° °  Subjective:  Patient presents today status post bunionectomy with tailor's bunionectomy left.. DOS: 03/31/2020.  Patient states that she is doing well.  She is back at work as a Pharmacist, hospital.  She says that she is able to rest her foot and sit the majority of the day.  She is weightbearing as tolerated in the cam boot.  No new complaints at this time  No past medical history on file.    Objective/Physical Exam Neurovascular status intact.  Skin incisions appear to be well coapted with intact. No sign of infectious process noted. No dehiscence. No active bleeding noted. Moderate edema noted to the surgical extremity.  Radiographic Exam:  Orthopedic hardware and osteotomies sites appear to be stable with routine healing.  Assessment: 1. s/p left foot bunion and tailor's bunionectomies. DOS: 02/28/2021   Plan of Care:  1. Patient was evaluated. X-rays reviewed that were taken last visit 2.  Sutures removed today. 3.  Continue minimal weightbearing in the cam boot over the next 4 weeks 4.  Recommend compression daily.  Ace wrap provided 5.  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 Edrick Kins, DPM    [address], [place] [address]                Office [phone]  Fax [phone]

## 2021-03-27 ENCOUNTER — Encounter: Payer: Commercial Managed Care - PPO | Admitting: Podiatry

## 2021-04-04 ENCOUNTER — Ambulatory Visit (INDEPENDENT_AMBULATORY_CARE_PROVIDER_SITE_OTHER): Payer: Commercial Managed Care - PPO

## 2021-04-04 ENCOUNTER — Other Ambulatory Visit: Payer: Self-pay

## 2021-04-04 ENCOUNTER — Telehealth: Payer: Self-pay | Admitting: Podiatry

## 2021-04-04 DIAGNOSIS — Z9889 Other specified postprocedural states: Secondary | ICD-10-CM

## 2021-04-04 NOTE — Telephone Encounter (Signed)
If there is availability on the triage nurse schedule go ahead and bring her in.  Thanks, Dr. Amalia Hailey

## 2021-04-04 NOTE — Telephone Encounter (Signed)
Patient called back , she wants to  know if she can stop by and have triage nurse look at her foot?

## 2021-04-04 NOTE — Progress Notes (Signed)
Patient in office today for suture removal. Patient seen by Dr. Amalia Hailey and one suture was removed from the medial aspect of the left foot and one suture was removed from the lateral aspect of the left foot. Patient tolerated well. Patient was dressed with a band-aid to cover the area. Patient advised to keep upcoming appointment with provider. Patient verbalized understanding.

## 2021-04-04 NOTE — Telephone Encounter (Signed)
That's fine. It sounds like it's an absorbable suture that her body is trying to push out. She can make an appt whenever. - Dr. Amalia Hailey

## 2021-04-04 NOTE — Telephone Encounter (Signed)
Patient has an upcoming appointment on 04/10/21

## 2021-04-04 NOTE — Telephone Encounter (Signed)
Patient called and stated she had sx December 22 and on her left foot, she is seeing a white string coming out of her incision. This area will not heal. Patient wanted to come in today to have it looked at.  Please advise

## 2021-04-04 NOTE — Telephone Encounter (Signed)
Patient coming in today to see [place].  Thank you

## 2021-04-10 ENCOUNTER — Other Ambulatory Visit: Payer: Self-pay

## 2021-04-10 ENCOUNTER — Ambulatory Visit (INDEPENDENT_AMBULATORY_CARE_PROVIDER_SITE_OTHER): Payer: Commercial Managed Care - PPO | Admitting: Podiatry

## 2021-04-10 ENCOUNTER — Ambulatory Visit (INDEPENDENT_AMBULATORY_CARE_PROVIDER_SITE_OTHER): Payer: Commercial Managed Care - PPO

## 2021-04-10 DIAGNOSIS — Z9889 Other specified postprocedural states: Secondary | ICD-10-CM

## 2021-04-10 NOTE — Progress Notes (Signed)
° °  Subjective:  Patient presents today status post bunionectomy with tailor's bunionectomy left.. DOS: 02/28/2021.  Patient states that she is doing well.  Patient presents today wearing her cam boot.  She says overall she is doing well and she has more pain associated to her right foot than her left.  She presents for further treatment and evaluation  No past medical history on file.    Objective/Physical Exam Neurovascular status intact.  Skin incisions healed.  No sign of infectious process noted. No dehiscence. No active bleeding noted.  There continues to be some moderate edema noted to the surgical extremity.  Radiographic Exam:  Orthopedic hardware and osteotomies sites appear to be stable with routine healing.  Good alignment of the first ray and the orthopedic screws are intact to the first and fifth metatarsals.  Mostly unchanged compared to prior x-rays  Assessment: 1. s/p left foot bunion and tailor's bunionectomies. DOS: 02/28/2021   Plan of Care:  1. Patient was evaluated. 2.  Patient may now transition out of the cam boot into good supportive shoes and sneakers 3.  Slowly increase activity over the next 4 weeks 4.  Compression ankle sleeve dispensed.  Wear daily.  I did explain to the patient that swelling is generally one of the last issues to resolve over time.  This should improve over the next few months 5.  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 Edrick Kins, DPM    [address], [place] [address]                Office [phone]  Fax [phone]

## 2021-09-11 ENCOUNTER — Other Ambulatory Visit: Payer: Self-pay | Admitting: Physician Assistant

## 2021-09-11 DIAGNOSIS — R590 Localized enlarged lymph nodes: Secondary | ICD-10-CM

## 2021-09-18 ENCOUNTER — Ambulatory Visit: Payer: Commercial Managed Care - PPO | Admitting: Podiatry

## 2021-09-19 ENCOUNTER — Other Ambulatory Visit: Payer: Commercial Managed Care - PPO

## 2021-09-19 ENCOUNTER — Ambulatory Visit
Admission: RE | Admit: 2021-09-19 | Discharge: 2021-09-19 | Disposition: A | Discharge status: Home / Self Care | Payer: Commercial Managed Care - PPO | Source: Ambulatory Visit | Attending: Physician Assistant | Admitting: Physician Assistant

## 2021-09-19 DIAGNOSIS — R590 Localized enlarged lymph nodes: Secondary | ICD-10-CM

## 2021-09-27 ENCOUNTER — Other Ambulatory Visit: Payer: Self-pay | Admitting: *Deleted

## 2021-09-27 DIAGNOSIS — R599 Enlarged lymph nodes, unspecified: Secondary | ICD-10-CM

## 2021-10-21 ENCOUNTER — Ambulatory Visit: Payer: Commercial Managed Care - PPO

## 2021-10-21 ENCOUNTER — Ambulatory Visit (INDEPENDENT_AMBULATORY_CARE_PROVIDER_SITE_OTHER): Payer: Commercial Managed Care - PPO

## 2021-10-21 ENCOUNTER — Ambulatory Visit (INDEPENDENT_AMBULATORY_CARE_PROVIDER_SITE_OTHER): Payer: Commercial Managed Care - PPO | Admitting: Podiatry

## 2021-10-21 DIAGNOSIS — M84374A Stress fracture, right foot, initial encounter for fracture: Secondary | ICD-10-CM

## 2021-10-21 NOTE — Progress Notes (Signed)
   Chief Complaint  Patient presents with   Follow-up    F/U FROM SX - FOOT IS STILL SWOLLEN AND PAINFUL Right foot some days it is ok and others it is not.    HPI: 56 y.o. female presenting today for evaluation of right foot pain this been going on for about 1 year now.  Patient has history of right foot surgery 2022.  Patient also states that she spent all summer in [place] doing a significant amount of walking.  She has not done anything recently for treatment.  She presents for further treatment and evaluation  No past medical history on file.  Past Surgical History:  Procedure Laterality Date   BREAST ENHANCEMENT SURGERY     CESAREAN SECTION  1998   PARTIAL HYSTERECTOMY      No Known Allergies   Physical Exam: General: The patient is alert and oriented x3 in no acute distress.  Dermatology: Skin is warm, dry and supple bilateral lower extremities. Negative for open lesions or macerations.  Vascular: Palpable pedal pulses bilaterally. Capillary refill within normal limits.  Negative for any significant edema or erythema  Neurological: Light touch and protective threshold grossly intact  Musculoskeletal Exam: No pedal deformities noted.  There is some tenderness to palpation along the second metatarsal of the right foot  Radiographic Exam B/L feet 10/21/2021:  Normal osseous mineralization.  The orthopedic hardware to the right midtarsal joint and first MTP is stable and intact.  Good osseous union and healing of the arthrodesis sites.  There is a stress reaction fracture which appears chronic with callus formation to the diaphysis of the second metatarsal without displacement.  Left foot also demonstrates good healing with orthopedic screws intact into the first metatarsal and fifth metatarsal consistent with given history of prior bunion and tailor's bunion surgery.  Assessment: 1.  Stress fracture second metatarsal right foot 2. PSxHx bilateral foot surgery  Plan of  Care:  1. Patient evaluated. X-Rays reviewed with the patient and the stress reaction fracture was discussed 2.  Cam boot was dispensed.  Weightbearing as tolerated x8 weeks 3.  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 Edrick Kins, DPM    [address], [place] [address]                Office [phone]  Fax [phone]

## 2021-12-06 ENCOUNTER — Other Ambulatory Visit: Payer: Self-pay | Admitting: *Deleted

## 2021-12-06 DIAGNOSIS — Z1231 Encounter for screening mammogram for malignant neoplasm of breast: Secondary | ICD-10-CM

## 2022-01-07 ENCOUNTER — Ambulatory Visit
Admission: RE | Admit: 2022-01-07 | Discharge: 2022-01-07 | Disposition: A | Discharge status: Home / Self Care | Payer: Commercial Managed Care - PPO | Source: Ambulatory Visit | Attending: *Deleted | Admitting: *Deleted

## 2022-01-07 DIAGNOSIS — Z1231 Encounter for screening mammogram for malignant neoplasm of breast: Secondary | ICD-10-CM

## 2022-09-02 ENCOUNTER — Telehealth: Payer: Self-pay | Admitting: *Deleted

## 2022-09-02 NOTE — Telephone Encounter (Signed)
Pt called and wanted to become a new patient of yours. I let her know we were not accepting new patients right now but she insisted I ask if she could be a new patient of yours?

## 2022-09-05 NOTE — Telephone Encounter (Signed)
I am ok taking her as a new patient if she is ok knowing that it will be later in the year. Thank you!

## 2023-01-07 IMAGING — MR MR FOOT*R* W/O CM
4 of 5 series · 19 of 40 positions shown · non-contrast
Comparison: Plain films right foot 04/04/2020.

CLINICAL DATA: Chronic right foot pain and midfoot osteoarthritis.
No known injury.

EXAM:
MRI OF THE RIGHT FOREFOOT WITHOUT CONTRAST
TECHNIQUE: Multiplanar, multisequence MR imaging of the right forefoot was
performed. No intravenous contrast was administered.

[Series 4: T1 · coronal · 3.0mm · 0.22mm/px · 3 of 44 slices shown (1 of 2)]
[im 5/44]
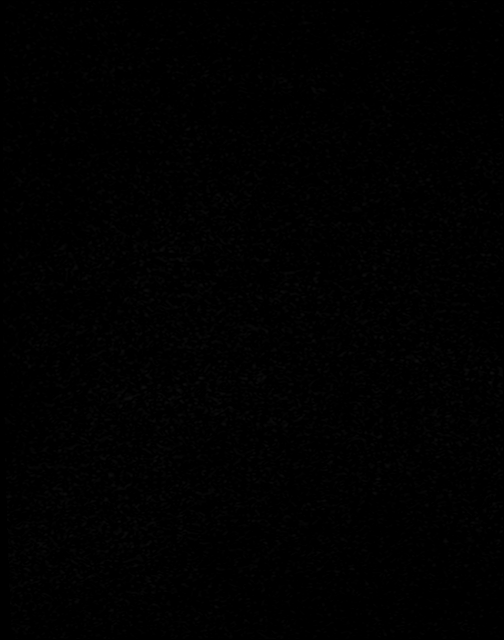
[im 22/44]
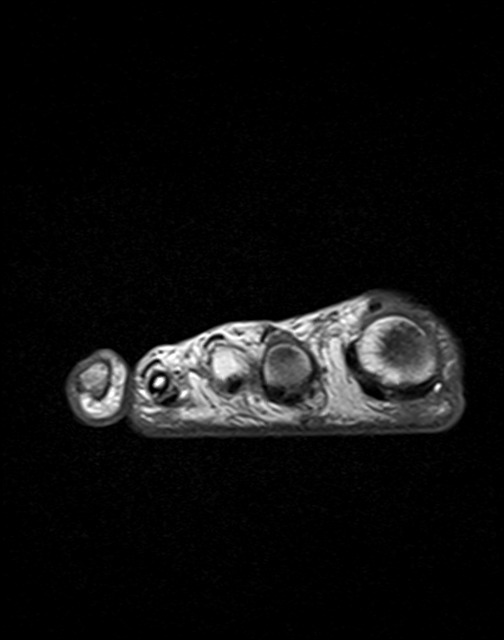
[im 39/44]
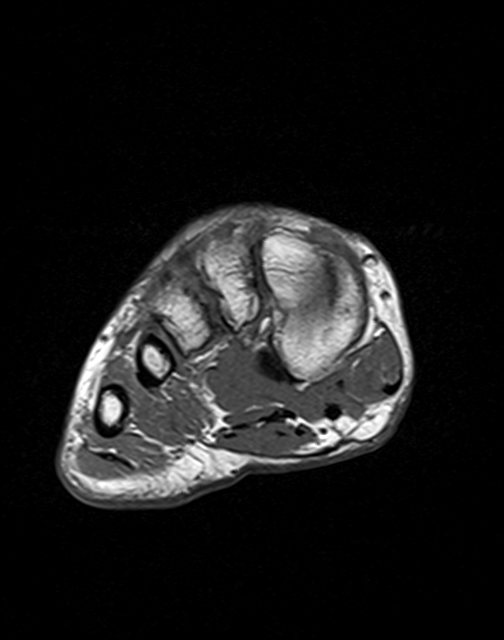

[Series 5: T2 fat-sat · coronal · 3.0mm · 0.22mm/px · 10 of 44 slices shown (1 of 2)]
[im 1/44]
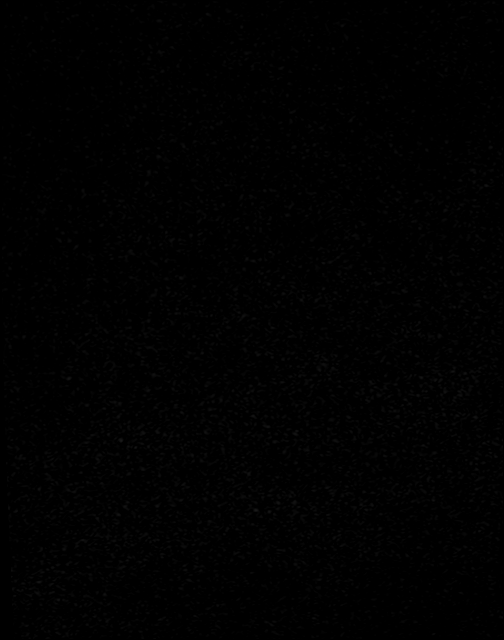
[im 5/44]
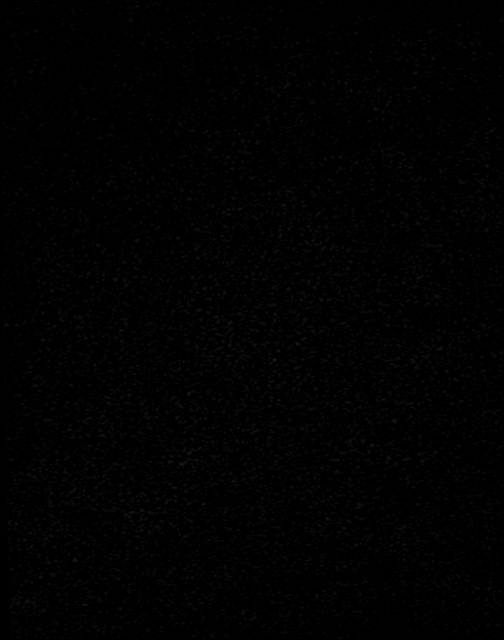
[im 9/44]
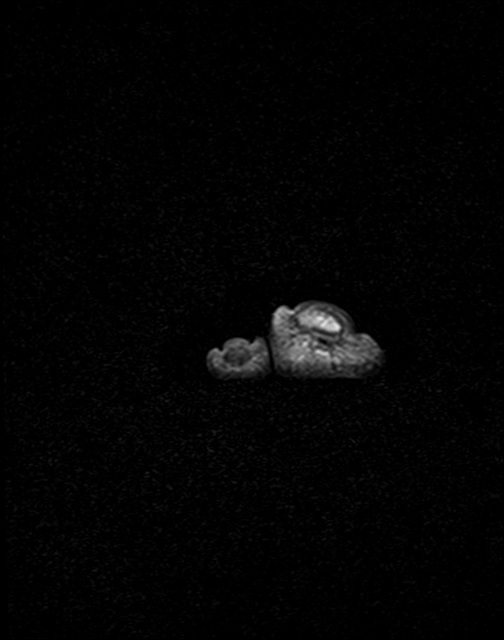
[im 13/44]
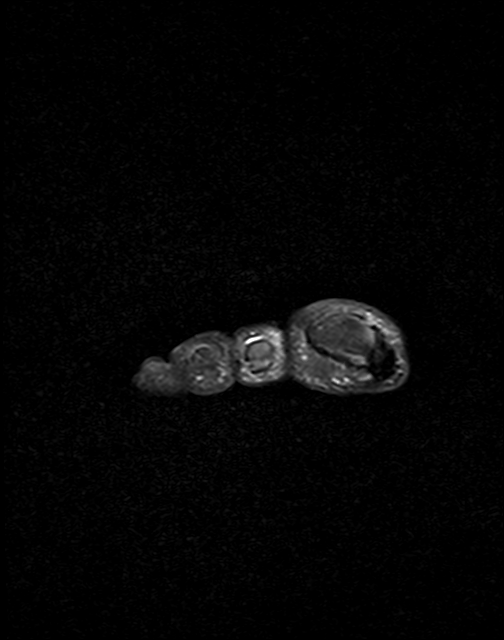
[im 18/44]
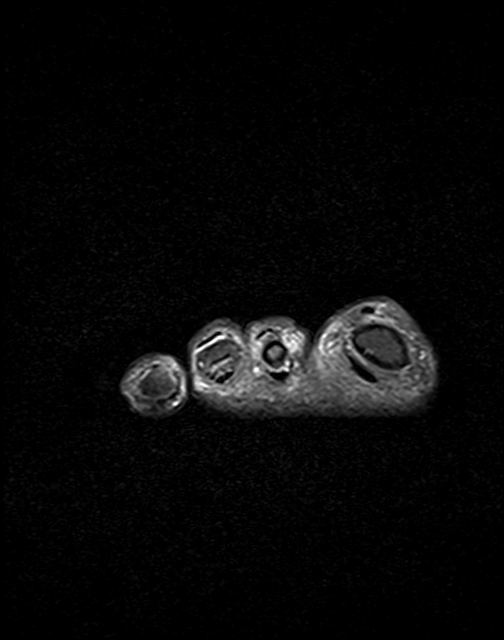
[im 22/44]
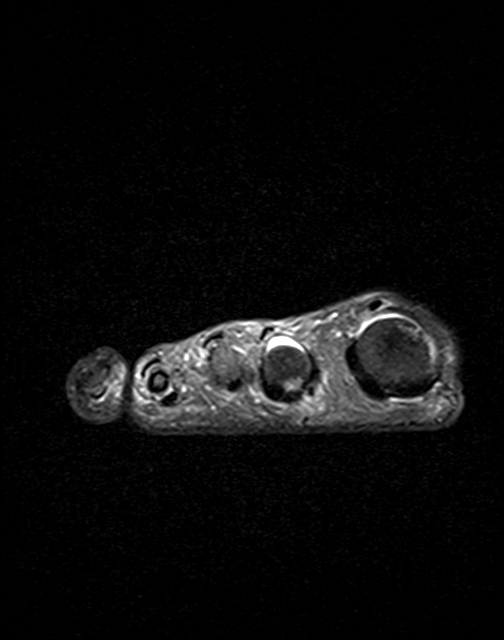
[im 26/44]
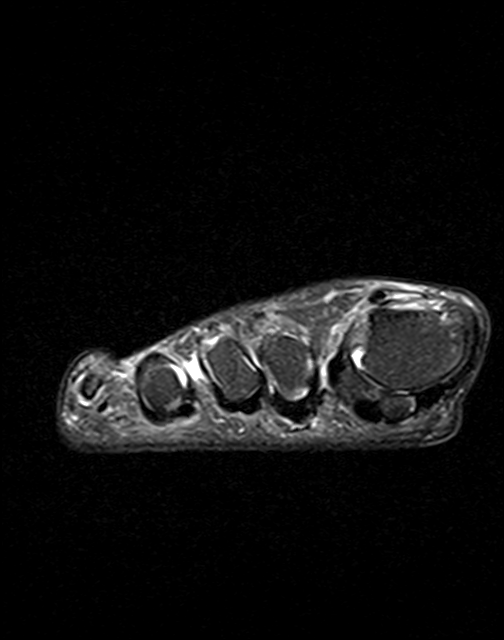
[im 31/44]
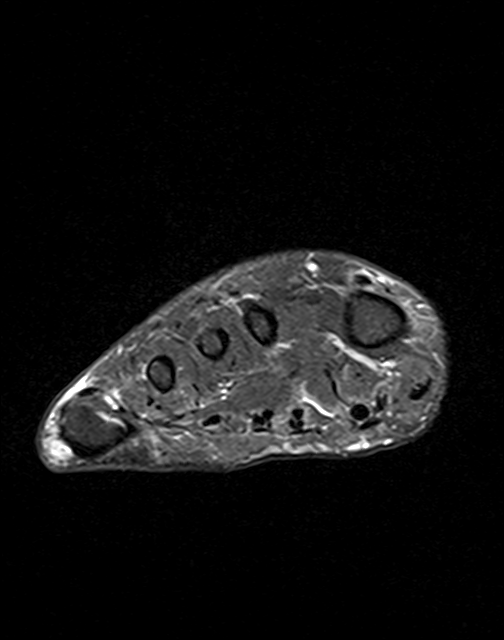
[im 35/44]
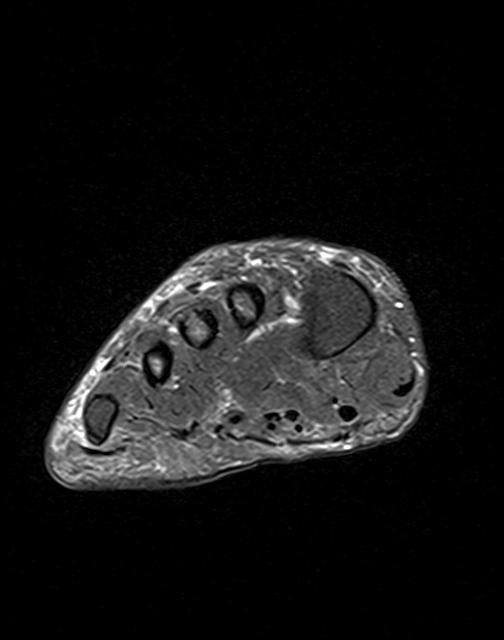
[im 39/44]
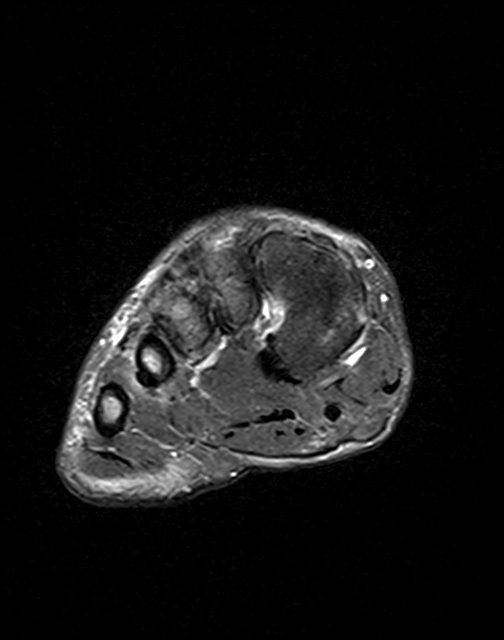

[Series 6: T2 fat-sat · axial · 3.0mm · 0.35mm/px · z∈[-118,-42]mm · 3 of 22 slices shown (2 of 2)]
[im 1/22]
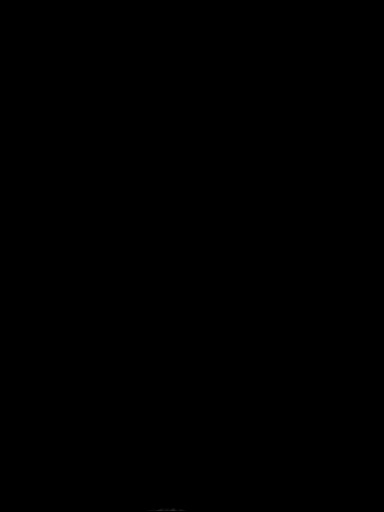
[im 11/22]
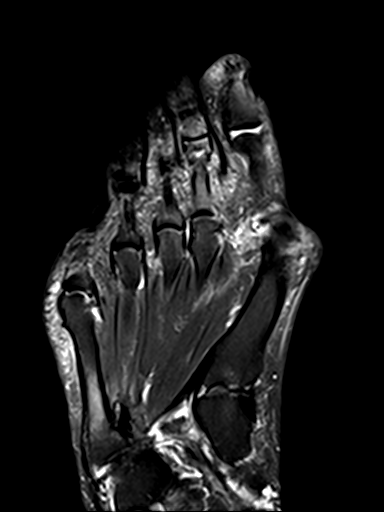
[im 22/22]
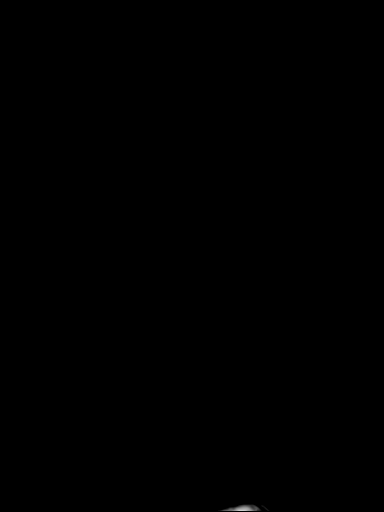

[Series 7: T1 · axial · 3.0mm · 0.35mm/px · z∈[-118,-42]mm · 3 of 22 slices shown (2 of 2)]
[im 1/22]
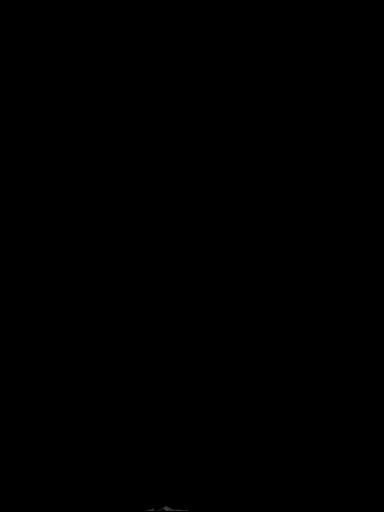
[im 11/22]
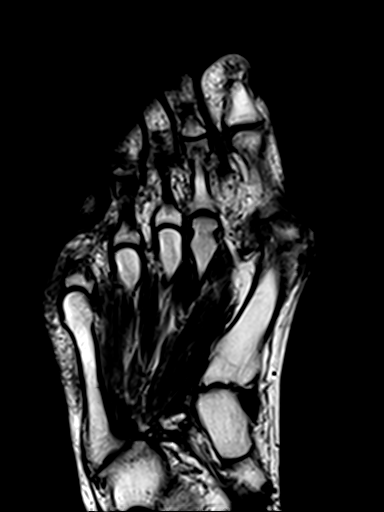
[im 22/22]
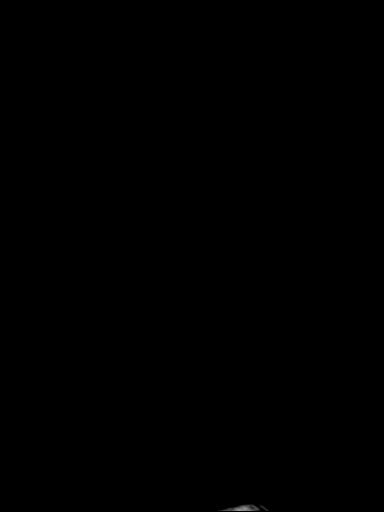

[19 of 40 positions shown; findings below may reference images not displayed]

FINDINGS: Bones/Joint/Cartilage

Marked hallux valgus deformity is identified as seen on the prior
plain films. There is marrow edema in the proximal diaphyses of the
third, fourth and fifth metatarsals consistent with stress change.
No fracture. Midfoot osteoarthritis appears worst at the third
tarsometatarsal joint. There is also moderate first MTP
osteoarthritis. No joint effusion.

Ligaments

Appear intact.

Muscles and Tendons

Intrinsic musculature intact. Intrinsic musculature the foot is
preserved.

Soft tissues

Negative.  No fluid collection or mass.
IMPRESSION: Marrow edema in the diaphyses of the third, fourth and fifth
metatarsals consistent with stress change. No fracture.

Midfoot osteoarthritis appears most severe at the third TMT joint.

Marked hallux valgus and mild to moderate first MTP osteoarthritis.

## 2024-02-17 ENCOUNTER — Other Ambulatory Visit: Payer: Self-pay | Admitting: Internal Medicine

## 2024-02-17 DIAGNOSIS — Z1231 Encounter for screening mammogram for malignant neoplasm of breast: Secondary | ICD-10-CM

## 2024-03-16 ENCOUNTER — Ambulatory Visit
Admission: RE | Admit: 2024-03-16 | Discharge: 2024-03-16 | Disposition: A | Discharge status: Home / Self Care | Source: Ambulatory Visit | Attending: Internal Medicine | Admitting: Internal Medicine

## 2024-03-16 DIAGNOSIS — Z1231 Encounter for screening mammogram for malignant neoplasm of breast: Secondary | ICD-10-CM
